# Patient Record
Sex: Female | Born: 1945 | Race: White | Hispanic: No | Marital: Married | Smoking: Former smoker
Health system: Southern US, Community
[De-identification: ages and names within clinical notes are randomized; demographics above are authoritative.]

## PROBLEM LIST (undated history)

## (undated) DIAGNOSIS — S22079A Unspecified fracture of T9-T10 vertebra, initial encounter for closed fracture: Secondary | ICD-10-CM

## (undated) DIAGNOSIS — I351 Nonrheumatic aortic (valve) insufficiency: Secondary | ICD-10-CM

## (undated) DIAGNOSIS — E785 Hyperlipidemia, unspecified: Secondary | ICD-10-CM

## (undated) DIAGNOSIS — E039 Hypothyroidism, unspecified: Secondary | ICD-10-CM

## (undated) DIAGNOSIS — M810 Age-related osteoporosis without current pathological fracture: Secondary | ICD-10-CM

## (undated) HISTORY — DX: Hyperlipidemia, unspecified: E78.5

## (undated) HISTORY — PX: TUBAL LIGATION: SHX77

## (undated) HISTORY — DX: Nonrheumatic aortic (valve) insufficiency: I35.1

## (undated) HISTORY — DX: Unspecified fracture of t9-t10 vertebra, initial encounter for closed fracture: S22.079A

## (undated) HISTORY — DX: Hypothyroidism, unspecified: E03.9

## (undated) HISTORY — DX: Age-related osteoporosis without current pathological fracture: M81.0

---

## 2018-10-01 ENCOUNTER — Other Ambulatory Visit: Payer: Self-pay

## 2018-10-01 DIAGNOSIS — Z20822 Contact with and (suspected) exposure to covid-19: Secondary | ICD-10-CM

## 2018-10-02 LAB — NOVEL CORONAVIRUS, NAA: SARS-CoV-2, NAA: NOT DETECTED

## 2018-12-31 ENCOUNTER — Other Ambulatory Visit: Payer: Self-pay

## 2018-12-31 ENCOUNTER — Emergency Department
Admission: EM | Admit: 2018-12-31 | Discharge: 2018-12-31 | Disposition: A | Discharge status: Home / Self Care | Payer: Medicare HMO | Attending: Emergency Medicine | Admitting: Emergency Medicine

## 2018-12-31 ENCOUNTER — Emergency Department: Payer: Medicare HMO

## 2018-12-31 DIAGNOSIS — M5136 Other intervertebral disc degeneration, lumbar region: Secondary | ICD-10-CM | POA: Diagnosis not present

## 2018-12-31 DIAGNOSIS — M545 Low back pain: Secondary | ICD-10-CM | POA: Diagnosis present

## 2018-12-31 DIAGNOSIS — Z87891 Personal history of nicotine dependence: Secondary | ICD-10-CM | POA: Diagnosis not present

## 2018-12-31 LAB — URINALYSIS, COMPLETE (UACMP) WITH MICROSCOPIC
Bilirubin Urine: NEGATIVE
Glucose, UA: NEGATIVE mg/dL
Hgb urine dipstick: NEGATIVE
Ketones, ur: NEGATIVE mg/dL
Nitrite: NEGATIVE
Protein, ur: NEGATIVE mg/dL
Specific Gravity, Urine: 1.019 (ref 1.005–1.030)
pH: 5 (ref 5.0–8.0)

## 2018-12-31 MED ORDER — TRAMADOL HCL 50 MG PO TABS
50.0000 mg | ORAL_TABLET | Freq: Once | ORAL | Status: AC
  Administered 2018-12-31: 50 mg via ORAL
  Filled 2018-12-31: qty 1

## 2018-12-31 MED ORDER — TRAMADOL HCL 50 MG PO TABS: 50.0000 mg | ORAL_TABLET | Freq: Four times a day (QID) | ORAL | 0 refills | Status: DC | PRN

## 2018-12-31 NOTE — ED Triage Notes (Signed)
Pt c/o lower back pain for the past 2 days, denies any injury.

## 2018-12-31 NOTE — ED Notes (Signed)

## 2018-12-31 NOTE — ED Provider Notes (Signed)
[facility] Emergency Department Provider Note  ____________________________________________   None    (approximate)  I have reviewed the triage vital signs and the nursing notes.   HISTORY  Chief Complaint Back Pain   HPI Sandra Riddle is a 73 y.o. female presents to the ED with complaint of low back pain that is gotten worse over the last 2 days.  Patient denies any fall.  Patient reports that she has been moving a lot of things in her house and has been picking up boxes.  She denies any incontinence of bowel or bladder or saddle anesthesias.  There is no radicular pain.  Patient continues to to ambulate without any assistance until this morning.  Range of motion increases her pain.  She rates her pain as a 10/10.       History reviewed. No pertinent past medical history.  There are no active problems to display for this patient.   Past Surgical History:  Procedure Laterality Date  . TUBAL LIGATION      Prior to Admission medications   Medication Sig Start Date End Date Taking? Authorizing Provider  traMADol (ULTRAM) 50 MG tablet Take 1 tablet (50 mg total) by mouth every 6 (six) hours as needed for moderate pain. 12/31/18   Tommi RumpsSummers, Hillary Struss L, PA-C    Allergies Patient has no known allergies.  No family history on file.  Social History Social History   Tobacco Use  . Smoking status: Former Games developermoker  . Smokeless tobacco: Never Used  Substance Use Topics  . Alcohol use: Not Currently  . Drug use: Not Currently    Review of Systems Constitutional: No fever/chills Eyes: No visual changes. Cardiovascular: Denies chest pain. Respiratory: Denies shortness of breath. Gastrointestinal: No abdominal pain.  No nausea, no vomiting. Genitourinary: Negative for dysuria. Musculoskeletal: Positive for low back pain. Skin: Negative for rash. Neurological: Negative for headaches, focal weakness or numbness. ___________________________________________    PHYSICAL EXAM:  VITAL SIGNS: ED Triage Vitals  Enc Vitals Group     BP 12/31/18 0729 (!) 181/62     Pulse Rate 12/31/18 0729 78     Resp 12/31/18 0729 18     Temp 12/31/18 0729 98.4 F (36.9 C)     Temp Source 12/31/18 0729 Oral     SpO2 12/31/18 0729 100 %     Weight 12/31/18 0719 160 lb (72.6 kg)     Height 12/31/18 0719 5\' 2"  (1.575 m)     Head Circumference --      Peak Flow --      Pain Score 12/31/18 0719 10     Pain Loc --      Pain Edu? --      Excl. in [place]? --     Constitutional: Alert and oriented. Well appearing and in no acute distress. Eyes: Conjunctivae are normal.  Head: Atraumatic. Neck: No stridor.   Cardiovascular: Normal rate, regular rhythm. Grossly normal heart sounds.  Good peripheral circulation. Respiratory: Normal respiratory effort.  No retractions. Lungs CTAB. Gastrointestinal: Soft and nontender. No distention. No CVA tenderness. Musculoskeletal: On exam of the lower back there is no gross deformity however there is moderate tenderness to the L5-S1 area and paravertebral muscles bilaterally.  Moderate soft tissue tenderness SI joint area bilaterally but more on the left.  Range of motion is restricted secondary to patient's pain.  No skin discoloration or abrasions are seen.  Good muscle strength bilaterally on the lower extremities.  No point tenderness  is noted of the thoracic spine. Neurologic:  Normal speech and language. No gross focal neurologic deficits are appreciated. No gait instability. Skin:  Skin is warm, dry and intact. No rash noted. Psychiatric: Mood and affect are normal. Speech and behavior are normal.  ____________________________________________   LABS (all labs ordered are listed, but only abnormal results are displayed)  Labs Reviewed  URINALYSIS, COMPLETE (UACMP) WITH MICROSCOPIC - Abnormal; Notable for the following components:      Result Value   Color, Urine YELLOW (*)    APPearance HAZY (*)    Leukocytes,Ua TRACE  (*)    Bacteria, UA RARE (*)    All other components within normal limits    RADIOLOGY  Official radiology report(s): Dg Lumbar Spine 2-3 Views  Result Date: 12/31/2018 CLINICAL DATA:  Low back pain EXAM: LUMBAR SPINE - 2-3 VIEW COMPARISON:  None. FINDINGS: Frontal, lateral, and spot lumbosacral lateral images were obtained. There are 5 non-rib-bearing lumbar type vertebral bodies. There is mild lumbar dextroscoliosis. There is mild anterior wedging at T12 and L1. No other evident fracture. There is 3 mm of anterolisthesis of L5 on S1. No other spondylolisthesis evident. No other spondylolisthesis. There is moderate disc space narrowing at T12-L1, L1-2, and L2-3. There is slight disc space narrowing at L5-S1. Other disc spaces appear unremarkable. No erosive change evident. There is aortic atherosclerosis. IMPRESSION: 1. Age uncertain anterior wedging at T12 and to a slightly lesser extent at L1. 2. 3 mm of anterolisthesis of L5 on S1, likely due to underlying spondylosis. No other spondylolisthesis evident. 3. Disc space narrowing at several levels as noted. No erosive change. 4.  Aortic Atherosclerosis (ICD10-I70.0). Electronically Signed   By: Lowella Grip III M.D.   On: 12/31/2018 09:02    ____________________________________________   PROCEDURES  Procedure(s) performed (including Critical Care):  Procedures ____________________________________________   INITIAL IMPRESSION / ASSESSMENT AND PLAN / ED COURSE  As part of my medical decision making, I reviewed the following data within the electronic MEDICAL RECORD NUMBER Notes from prior ED visits and  Controlled Substance Database  73 year old female presents to the ED with complaint of low back pain for the last 2 days.  Patient states that she has been moving things in her home and there was not a known injury.  Patient denies any fall.  She is continue to ambulate without any assistance is been taking Tylenol for the last 2 days.   Patient states this morning when she got up it was more difficult.  She denied any urinary or bladder incontinence.  There is been no prior back problems or injuries.  Physical exam is consistent with a muscle skeletal strain.  Urinalysis was essentially negative with rare bacteria.  Patient was given tramadol prior to x-ray and patient states that she is feeling much better and actually walked to the restroom.  A prescription for tramadol was sent to the pharmacy.  Patient is aware that this medication could cause drowsiness and increase her risk for injury.  She will also use heat or ice to her lower back as needed.  We also discussed finding a PCP with both she and the daughter.  ____________________________________________   FINAL CLINICAL IMPRESSION(S) / ED DIAGNOSES  Final diagnoses:  Degenerative disc disease, lumbar     ED Discharge Orders         Ordered    traMADol (ULTRAM) 50 MG tablet  Every 6 hours PRN     12/31/18 0934  Note:  This document was prepared using Dragon voice recognition software and may include unintentional dictation errors.    Tommi Rumps, PA-C 12/31/18 1233    Emily Filbert, MD 12/31/18 1248

## 2018-12-31 NOTE — Discharge Instructions (Addendum)
Call [facility] make an appointment to establish a primary care provider.  Also the tramadol that you were given while in the ED was sent to [facility].  This medication is every 6 hours if needed for pain.  Be aware that this medication could cause drowsiness and increase your risk for falling.  You may use ice or heat to your back as needed for discomfort.  No lifting, pushing or pulling at this time.  No reaching above shoulder level.  Return to the emergency department if any severe worsening of your symptoms.

## 2019-01-09 ENCOUNTER — Other Ambulatory Visit: Payer: Self-pay | Admitting: Internal Medicine

## 2019-01-09 DIAGNOSIS — M4856XA Collapsed vertebra, not elsewhere classified, lumbar region, initial encounter for fracture: Secondary | ICD-10-CM

## 2019-01-12 ENCOUNTER — Other Ambulatory Visit: Payer: Self-pay

## 2019-01-12 ENCOUNTER — Ambulatory Visit
Admission: RE | Admit: 2019-01-12 | Discharge: 2019-01-12 | Disposition: A | Discharge status: Home / Self Care | Payer: Medicare HMO | Source: Ambulatory Visit | Attending: Internal Medicine | Admitting: Internal Medicine

## 2019-01-12 DIAGNOSIS — M4856XA Collapsed vertebra, not elsewhere classified, lumbar region, initial encounter for fracture: Secondary | ICD-10-CM | POA: Diagnosis not present

## 2019-04-20 ENCOUNTER — Encounter (INDEPENDENT_AMBULATORY_CARE_PROVIDER_SITE_OTHER): Payer: Medicare HMO | Admitting: Ophthalmology

## 2019-04-22 ENCOUNTER — Encounter (INDEPENDENT_AMBULATORY_CARE_PROVIDER_SITE_OTHER): Payer: Medicare HMO | Admitting: Ophthalmology

## 2019-04-23 ENCOUNTER — Encounter (INDEPENDENT_AMBULATORY_CARE_PROVIDER_SITE_OTHER): Payer: Medicare HMO | Admitting: Ophthalmology

## 2019-04-23 ENCOUNTER — Other Ambulatory Visit: Payer: Self-pay

## 2019-04-23 DIAGNOSIS — H35373 Puckering of macula, bilateral: Secondary | ICD-10-CM

## 2019-04-23 DIAGNOSIS — H26493 Other secondary cataract, bilateral: Secondary | ICD-10-CM

## 2019-04-23 DIAGNOSIS — H43813 Vitreous degeneration, bilateral: Secondary | ICD-10-CM | POA: Diagnosis not present

## 2019-04-23 DIAGNOSIS — H59033 Cystoid macular edema following cataract surgery, bilateral: Secondary | ICD-10-CM | POA: Diagnosis not present

## 2019-06-04 ENCOUNTER — Encounter (INDEPENDENT_AMBULATORY_CARE_PROVIDER_SITE_OTHER): Payer: Medicare HMO | Admitting: Ophthalmology

## 2019-06-04 DIAGNOSIS — H59033 Cystoid macular edema following cataract surgery, bilateral: Secondary | ICD-10-CM | POA: Diagnosis not present

## 2019-06-04 DIAGNOSIS — H35373 Puckering of macula, bilateral: Secondary | ICD-10-CM

## 2019-06-04 DIAGNOSIS — H43813 Vitreous degeneration, bilateral: Secondary | ICD-10-CM

## 2019-07-16 ENCOUNTER — Encounter (INDEPENDENT_AMBULATORY_CARE_PROVIDER_SITE_OTHER): Payer: Medicare HMO | Admitting: Ophthalmology

## 2019-07-16 ENCOUNTER — Other Ambulatory Visit: Payer: Self-pay

## 2019-07-16 DIAGNOSIS — H35371 Puckering of macula, right eye: Secondary | ICD-10-CM

## 2019-07-16 DIAGNOSIS — H43813 Vitreous degeneration, bilateral: Secondary | ICD-10-CM | POA: Diagnosis not present

## 2019-07-16 DIAGNOSIS — H59033 Cystoid macular edema following cataract surgery, bilateral: Secondary | ICD-10-CM

## 2019-07-30 ENCOUNTER — Emergency Department
Admission: EM | Admit: 2019-07-30 | Discharge: 2019-07-30 | Disposition: A | Discharge status: Home / Self Care | Payer: Medicare HMO | Attending: Student in an Organized Health Care Education/Training Program | Admitting: Student in an Organized Health Care Education/Training Program

## 2019-07-30 ENCOUNTER — Other Ambulatory Visit: Payer: Self-pay

## 2019-07-30 ENCOUNTER — Emergency Department: Payer: Medicare HMO

## 2019-07-30 ENCOUNTER — Encounter: Payer: Self-pay | Admitting: Emergency Medicine

## 2019-07-30 DIAGNOSIS — S13100A Subluxation of unspecified cervical vertebrae, initial encounter: Secondary | ICD-10-CM | POA: Diagnosis not present

## 2019-07-30 DIAGNOSIS — R519 Headache, unspecified: Secondary | ICD-10-CM | POA: Insufficient documentation

## 2019-07-30 DIAGNOSIS — Y9389 Activity, other specified: Secondary | ICD-10-CM | POA: Diagnosis not present

## 2019-07-30 DIAGNOSIS — Z87891 Personal history of nicotine dependence: Secondary | ICD-10-CM | POA: Insufficient documentation

## 2019-07-30 DIAGNOSIS — Y92410 Unspecified street and highway as the place of occurrence of the external cause: Secondary | ICD-10-CM | POA: Insufficient documentation

## 2019-07-30 DIAGNOSIS — Y999 Unspecified external cause status: Secondary | ICD-10-CM | POA: Diagnosis not present

## 2019-07-30 DIAGNOSIS — S199XXA Unspecified injury of neck, initial encounter: Secondary | ICD-10-CM | POA: Diagnosis present

## 2019-07-30 MED ORDER — OXYCODONE HCL 5 MG PO TABS
5.0000 mg | ORAL_TABLET | Freq: Once | ORAL | Status: AC
  Administered 2019-07-30: 5 mg via ORAL
  Filled 2019-07-30: qty 1

## 2019-07-30 MED ORDER — DIAZEPAM 2 MG PO TABS: 2.0000 mg | ORAL_TABLET | Freq: Three times a day (TID) | ORAL | 0 refills | Status: DC | PRN
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
## 2019-07-30 NOTE — Discharge Instructions (Signed)
You have been prescribed a very low dose of Valium which is used as both a muscle relaxer and helps with anxiety.  If you take tramadol, make sure you space it out 4 hours before or after the muscle relaxer.  Follow-up with your primary care provider for symptoms that are not improving over the next week or so.  Use ice 20 minutes/h while awake on sore areas.  Return to the emergency department for symptoms of change or worsen if you are unable to schedule an appointment.

## 2019-08-27 ENCOUNTER — Encounter (INDEPENDENT_AMBULATORY_CARE_PROVIDER_SITE_OTHER): Payer: Medicare HMO | Admitting: Ophthalmology

## 2019-08-27 ENCOUNTER — Other Ambulatory Visit: Payer: Self-pay

## 2019-08-27 DIAGNOSIS — H35373 Puckering of macula, bilateral: Secondary | ICD-10-CM | POA: Diagnosis not present

## 2019-08-27 DIAGNOSIS — H59033 Cystoid macular edema following cataract surgery, bilateral: Secondary | ICD-10-CM

## 2019-08-27 DIAGNOSIS — H43813 Vitreous degeneration, bilateral: Secondary | ICD-10-CM | POA: Diagnosis not present

## 2019-10-21 ENCOUNTER — Encounter (INDEPENDENT_AMBULATORY_CARE_PROVIDER_SITE_OTHER): Payer: Medicare HMO | Admitting: Ophthalmology

## 2019-10-21 ENCOUNTER — Other Ambulatory Visit: Payer: Self-pay

## 2019-10-21 DIAGNOSIS — H35373 Puckering of macula, bilateral: Secondary | ICD-10-CM

## 2019-10-21 DIAGNOSIS — H59033 Cystoid macular edema following cataract surgery, bilateral: Secondary | ICD-10-CM

## 2019-10-21 DIAGNOSIS — H43813 Vitreous degeneration, bilateral: Secondary | ICD-10-CM

## 2019-10-22 ENCOUNTER — Encounter (INDEPENDENT_AMBULATORY_CARE_PROVIDER_SITE_OTHER): Payer: Medicare HMO | Admitting: Ophthalmology

## 2019-12-02 ENCOUNTER — Encounter (INDEPENDENT_AMBULATORY_CARE_PROVIDER_SITE_OTHER): Payer: Medicare HMO | Admitting: Ophthalmology

## 2019-12-02 ENCOUNTER — Other Ambulatory Visit: Payer: Self-pay

## 2019-12-02 DIAGNOSIS — H59033 Cystoid macular edema following cataract surgery, bilateral: Secondary | ICD-10-CM | POA: Diagnosis not present

## 2019-12-02 DIAGNOSIS — H35371 Puckering of macula, right eye: Secondary | ICD-10-CM

## 2020-01-27 ENCOUNTER — Other Ambulatory Visit: Payer: Self-pay

## 2020-01-27 ENCOUNTER — Encounter (INDEPENDENT_AMBULATORY_CARE_PROVIDER_SITE_OTHER): Payer: Medicare HMO | Admitting: Ophthalmology

## 2020-01-27 DIAGNOSIS — H59033 Cystoid macular edema following cataract surgery, bilateral: Secondary | ICD-10-CM

## 2020-01-27 DIAGNOSIS — H43813 Vitreous degeneration, bilateral: Secondary | ICD-10-CM

## 2020-01-27 DIAGNOSIS — H35373 Puckering of macula, bilateral: Secondary | ICD-10-CM

## 2020-03-28 ENCOUNTER — Encounter (INDEPENDENT_AMBULATORY_CARE_PROVIDER_SITE_OTHER): Payer: Medicare HMO | Admitting: Ophthalmology

## 2020-03-28 ENCOUNTER — Other Ambulatory Visit: Payer: Self-pay

## 2020-03-28 DIAGNOSIS — H35373 Puckering of macula, bilateral: Secondary | ICD-10-CM | POA: Diagnosis not present

## 2020-03-28 DIAGNOSIS — H59033 Cystoid macular edema following cataract surgery, bilateral: Secondary | ICD-10-CM | POA: Diagnosis not present

## 2020-03-28 DIAGNOSIS — H43813 Vitreous degeneration, bilateral: Secondary | ICD-10-CM

## 2020-06-24 ENCOUNTER — Other Ambulatory Visit: Payer: Self-pay

## 2020-06-24 ENCOUNTER — Encounter (INDEPENDENT_AMBULATORY_CARE_PROVIDER_SITE_OTHER): Payer: Medicare HMO | Admitting: Ophthalmology

## 2020-06-24 DIAGNOSIS — H35373 Puckering of macula, bilateral: Secondary | ICD-10-CM | POA: Diagnosis not present

## 2020-06-24 DIAGNOSIS — H43813 Vitreous degeneration, bilateral: Secondary | ICD-10-CM

## 2020-06-24 DIAGNOSIS — H59033 Cystoid macular edema following cataract surgery, bilateral: Secondary | ICD-10-CM

## 2020-10-24 ENCOUNTER — Encounter (INDEPENDENT_AMBULATORY_CARE_PROVIDER_SITE_OTHER): Payer: Medicare HMO | Admitting: Ophthalmology

## 2020-11-07 ENCOUNTER — Other Ambulatory Visit: Payer: Self-pay

## 2020-11-07 ENCOUNTER — Encounter (INDEPENDENT_AMBULATORY_CARE_PROVIDER_SITE_OTHER): Payer: Medicare HMO | Admitting: Ophthalmology

## 2020-11-07 DIAGNOSIS — H35372 Puckering of macula, left eye: Secondary | ICD-10-CM | POA: Diagnosis not present

## 2020-11-07 DIAGNOSIS — H59033 Cystoid macular edema following cataract surgery, bilateral: Secondary | ICD-10-CM

## 2020-11-07 DIAGNOSIS — H43813 Vitreous degeneration, bilateral: Secondary | ICD-10-CM | POA: Diagnosis not present
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
## 2021-03-09 ENCOUNTER — Other Ambulatory Visit: Payer: Self-pay

## 2021-03-09 ENCOUNTER — Encounter (INDEPENDENT_AMBULATORY_CARE_PROVIDER_SITE_OTHER): Payer: Medicare HMO | Admitting: Ophthalmology

## 2021-03-09 DIAGNOSIS — H59033 Cystoid macular edema following cataract surgery, bilateral: Secondary | ICD-10-CM

## 2021-03-09 DIAGNOSIS — H35373 Puckering of macula, bilateral: Secondary | ICD-10-CM

## 2021-03-09 DIAGNOSIS — H43813 Vitreous degeneration, bilateral: Secondary | ICD-10-CM | POA: Diagnosis not present

## 2021-07-07 ENCOUNTER — Encounter (INDEPENDENT_AMBULATORY_CARE_PROVIDER_SITE_OTHER): Payer: Medicare HMO | Admitting: Ophthalmology

## 2021-07-07 DIAGNOSIS — H43813 Vitreous degeneration, bilateral: Secondary | ICD-10-CM | POA: Diagnosis not present

## 2021-07-07 DIAGNOSIS — H59033 Cystoid macular edema following cataract surgery, bilateral: Secondary | ICD-10-CM

## 2021-07-07 DIAGNOSIS — H35373 Puckering of macula, bilateral: Secondary | ICD-10-CM

## 2021-11-10 ENCOUNTER — Encounter (INDEPENDENT_AMBULATORY_CARE_PROVIDER_SITE_OTHER): Payer: Medicare HMO | Admitting: Ophthalmology

## 2021-11-10 DIAGNOSIS — H35373 Puckering of macula, bilateral: Secondary | ICD-10-CM

## 2021-11-10 DIAGNOSIS — H59033 Cystoid macular edema following cataract surgery, bilateral: Secondary | ICD-10-CM | POA: Diagnosis not present

## 2021-11-10 DIAGNOSIS — H43813 Vitreous degeneration, bilateral: Secondary | ICD-10-CM

## 2022-03-08 ENCOUNTER — Other Ambulatory Visit: Payer: Self-pay | Admitting: Internal Medicine

## 2022-03-08 DIAGNOSIS — E785 Hyperlipidemia, unspecified: Secondary | ICD-10-CM

## 2022-03-08 DIAGNOSIS — R413 Other amnesia: Secondary | ICD-10-CM

## 2022-03-12 ENCOUNTER — Other Ambulatory Visit: Payer: Self-pay | Admitting: Internal Medicine

## 2022-03-12 DIAGNOSIS — Z8781 Personal history of (healed) traumatic fracture: Secondary | ICD-10-CM

## 2022-03-22 ENCOUNTER — Ambulatory Visit
Admission: RE | Admit: 2022-03-22 | Discharge: 2022-03-22 | Disposition: A | Discharge status: Home / Self Care | Payer: Medicare HMO | Source: Ambulatory Visit | Attending: Internal Medicine | Admitting: Internal Medicine

## 2022-03-22 DIAGNOSIS — Z8781 Personal history of (healed) traumatic fracture: Secondary | ICD-10-CM | POA: Diagnosis not present

## 2022-03-23 ENCOUNTER — Encounter (INDEPENDENT_AMBULATORY_CARE_PROVIDER_SITE_OTHER): Payer: Medicare HMO | Admitting: Ophthalmology

## 2022-03-23 ENCOUNTER — Other Ambulatory Visit: Payer: Self-pay

## 2022-03-23 ENCOUNTER — Telehealth: Payer: Self-pay | Admitting: Orthopedic Surgery

## 2022-03-23 ENCOUNTER — Ambulatory Visit
Admission: RE | Admit: 2022-03-23 | Discharge: 2022-03-23 | Disposition: A | Discharge status: Home / Self Care | Payer: Self-pay | Source: Ambulatory Visit | Attending: Orthopedic Surgery | Admitting: Orthopedic Surgery

## 2022-03-23 DIAGNOSIS — Z049 Encounter for examination and observation for unspecified reason: Secondary | ICD-10-CM
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
## 2022-03-23 NOTE — Progress Notes (Signed)
Referring Physician:  Donnamarie Rossetti, PA-C [place],  [place] [address]  Primary Physician:  Adin Hector, MD  History of Present Illness: 03/26/2022 [name] has a history of osteoporosis, hyperlipidemia, and hypothyroidism.   Saw her PCP on 03/05/22 with a few months of increased mid back pain. She cares for her husband at home. Xrays from that day showed T10 compression fracture. She had thoracic MRI on 03/22/22 and is here for evaluation.  She has constant mid to lower back pain that is now severe. Has been getting worse since her visit on 03/05/22. She notes radiation of pain into left side of her ribs. No arm or leg pain. No numbness, tingling, or weakness. Pain is worse with getting up, turning, and moving.   Recently given zanaflex by PCP along with norco 5- minimal relief with this.  Past Surgery: no lumbar surgery  Sandra Riddle has no symptoms of cervical myelopathy.  The symptoms are causing a significant impact on the patient's life.   Review of Systems:  A 10 point review of systems is negative, except for the pertinent positives and negatives detailed in the HPI.  Past Medical History: No past medical history on file.  Past Surgical History: Past Surgical History:  Procedure Laterality Date   TUBAL LIGATION      Allergies: Allergies as of 03/26/2022   (No Known Allergies)    Medications: Outpatient Encounter Medications as of 03/26/2022  Medication Sig   [DISCONTINUED] diazepam (VALIUM) 2 MG tablet Take 1 tablet (2 mg total) by mouth every 8 (eight) hours as needed for muscle spasms.   [DISCONTINUED] traMADol (ULTRAM) 50 MG tablet Take 1 tablet (50 mg total) by mouth every 6 (six) hours as needed for moderate pain.   No facility-administered encounter medications on file as of 03/26/2022.    Social History: Social History   Tobacco Use   Smoking status: Former   Smokeless tobacco: Never  Substance Use Topics   Alcohol  use: Not Currently   Drug use: Not Currently    Family Medical History: No family history on file.  Physical Examination: There were no vitals filed for this visit.  General: Patient is well developed, well nourished, calm, collected, and in no apparent distress. Attention to examination is appropriate.  Respiratory: Patient is breathing without any difficulty.   NEUROLOGICAL:     Awake, alert, oriented to person, place, and time.  Speech is clear and fluent. Fund of knowledge is appropriate.   Cranial Nerves: Pupils equal round and reactive to light.  Facial tone is symmetric.    ROM of TL spine not tested.   She has point tenderness at TL junction.   No abnormal lesions on exposed skin.   Strength: Side Biceps Triceps Deltoid Interossei Grip Wrist Ext. Wrist Flex.  R 5 5 5 5 5 5 5   L 5 5 5 5 5 5 5    Side Iliopsoas Quads Hamstring PF DF EHL  R 5 5 5 5 5 5   L 5 5 5 5 5 5    Reflexes are 2+ and symmetric at the biceps, triceps, brachioradialis, patella and achilles.   Hoffman's is absent.  Clonus is not present.   Bilateral upper and lower extremity sensation is intact to light touch.     Gait is slow.   She is in obvious pain and changes position frequently.   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   Thoracic spine segmentation: Appears to be normal, and concordant with that used on the 2020 lumbar MRI.   Alignment: Mild straightening of thoracic kyphosis. No significant spondylolisthesis.   Vertebrae: Chronic L1 compression fracture, vertebral plana now and mildly progressed compared to the loss of height in 2020.   Moderate to severe T10 compression fracture with up to 65% central loss of vertebral body height, confluent marrow edema throughout the compressed body, and edema tracking into the bilateral T10  pedicles. Probable associated nondisplaced right pedicle fracture on series 19, image 6. There is central retropulsion of bone at that level contributing to spinal stenosis. See additional details below.   Faint marrow edema at the T9 anterior inferior endplate is probably reactive. Otherwise T9 and T11 appear intact. Mild chronic T12 superior endplate compression is unchanged since 2020.   Background thoracic bone marrow signal is within normal limits, with intermittent benign thoracic vertebral hemangioma demonstrating intrinsic T1 hyperintensity. No other marrow edema or evidence of acute osseous abnormality.   Cord: The thoracic spinal cord is normal above T10. Mild cord mass effect at the T10 level, see details below. No convincing cord signal abnormality there.   Cord signal and morphology is normal at both T11 and T12. But chronic retropulsion of L1 results in mild mass effect just above the conus medullaris (series 17, image 8). No conus signal abnormality.   Paraspinal and other soft tissues: Negative visible thoracic and abdominal viscera except for trace layering pleural fluid bilaterally.   There is mild T10 level paraspinal edema or hematoma.   Disc levels:   Negative for age from the cervicothoracic junction through T8-T9.   T9-T10 and T10-T11: Relatively mild disc bulging. Retropulsion of the T10 vertebral body superimposed on mild facet and ligament flavum hypertrophy results in mild spinal stenosis and cord mass effect (series 17, image 10). And also contributes to mild bilateral T9 and T10 neural foraminal stenosis.   T11-T12: Negative for age   T12-L1: Circumferential disc bulging superimposed on chronic L1 retropulsion resulting in mild spinal stenosis here at the level of the conus medullaris. No significant foraminal stenosis.   IMPRESSION: 1. Acute or subacute T10 compression fracture is moderate to severe, with 65% central loss of vertebral body  height, probable associated nondisplaced right pedicle fracture, and retropulsion contributing to mild spinal stenosis at that level WITH spinal cord mass effect. No associated cord signal abnormality. Mild bilateral T9 and T10 foraminal stenosis.   2. 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 Chronic retropulsion of the latter resulting in mild spinal stenosis and mild mass effect at the conus medullaris, but no conus signal abnormality.   Electronically Signed: By: Genevie Saanya M.D. On: 03/23/2022 11:44  I have personally reviewed the images and agree with the above interpretation.  Xrays of thoracic spine dated 03/05/22:  Compression fracture at T10 and L1.   Radiology report not available for above xrays.   Above xrays and MRI reviewed with Dr. Izora Ribas prior to her visit.   Assessment and Plan: Ms. Tomczak is a pleasant 77 y.o. female has constant mid to lower back pain that is now severe. Has been getting worse since her visit on 03/05/22. She notes radiation of pain into left side of her ribs. No arm or leg pain. No numbness, tingling, or weakness.   Xrays from 03/05/22 showed  T10 compression fracture. MRI of thoracic spine shows acute/subacute compression fracture at T10 with probable right pedicle fracture. Also with chronic fractures at T12 and L1.   Imaging reviewed with Dr. Myer Haff prior to her visit. No surgery recommended. Can treat conservatively with TLSO brace and medications.   Treatment options discussed with patient and following plan made:   - She will stop the norco 5 and zanaflex.  - New prescription for norco 7.5 to take every 4 hours prn. Reviewed dosing and side effects.  - PMP reviewed- she was given 5 day supply of norco 5 on 03/23/22. This is not controlling her pain.  - Prescription for robaxin to take prn muscle spasms. Reviewed dosing and side effects. Discussed this can  cause drowsiness.  - Script for TLSO brace sent to [facility] in [place]. She was given their contact information.  - She should wear brace when up and walking. No bending, twisting, or lifting. Do not wear brace to sleep. Can remove brace when she sits and watches TV.  - Follow up with me in 4 weeks with repeat xrays.  - Will call her on Wednesday to check on her and make sure pain is more controlled.  - She will f/u with PCP regarding treatment of osteoporosis to see if anything needs changed.   I spent a total of 50 minutes in face-to-face and non-face-to-face activities related to this patient's care today including review of outside records, review of imaging, review of symptoms, physical exam, discussion of differential diagnosis, discussion of treatment options, and documentation.   Thank you for involving me in the care of this patient.   Drake Leach PA-C Dept. of Neurosurgery

## 2022-03-26 ENCOUNTER — Ambulatory Visit: Payer: Medicare HMO

## 2022-03-26 ENCOUNTER — Encounter: Payer: Self-pay | Admitting: Orthopedic Surgery

## 2022-03-26 ENCOUNTER — Ambulatory Visit (INDEPENDENT_AMBULATORY_CARE_PROVIDER_SITE_OTHER): Payer: Medicare HMO | Admitting: Orthopedic Surgery

## 2022-03-26 VITALS — BP 163/73 | HR 92 | Ht 59.0 in | Wt 142.8 lb

## 2022-03-26 DIAGNOSIS — S22079A Unspecified fracture of T9-T10 vertebra, initial encounter for closed fracture: Secondary | ICD-10-CM | POA: Diagnosis not present

## 2022-03-26 MED ORDER — METHOCARBAMOL 500 MG PO TABS: 500.0000 mg | ORAL_TABLET | Freq: Three times a day (TID) | ORAL | 0 refills | Status: DC | PRN

## 2022-03-26 MED ORDER — HYDROCODONE-ACETAMINOPHEN 7.5-325 MG PO TABS: 1.0000 | ORAL_TABLET | ORAL | 0 refills | Status: DC | PRN

## 2022-03-26 NOTE — Patient Instructions (Signed)
It was so nice to see you today. Thank you so much for coming in.    You have a compression fracture (broken bone) at T10 and I think this is what is causing your pain.   I sent a script for a brace to the [facility]. They should call you or you can call them at [phone]. They are at [employer] in [place].   You do no need to wear brace in bed. You can take off if you are sitting and watching TV. Wear the brace when you are up and about. No bending, twisting, or lifting.   I sent a prescription for norco 7.5 to help with severe pain. Take as directed with food and take only as needed. Stop the norco 5 for now. Remember this can make you sleepy and/or constipated.   I also sent a prescription for methocarbamol to help with muscle spasms. Use only as needed and be careful, this can make you sleepy.   I want to see you back in 4 weeks. I ordered xrays of your mid back to get before the appointment. Show up 45-60 minutes before your appointment with me. You can go across the street to get these at [place] (building with the [place] [address]). You do not need any appointment.   We will call you on Wednesday to check on you.   Please do not hesitate to call if you have any questions or concerns. You can also message me in [place].   Geronimo Boot PA-C [phone]

## 2022-03-26 NOTE — Progress Notes (Signed)
Order faxed to [facility] for TLSO brace.

## 2022-03-28 ENCOUNTER — Other Ambulatory Visit: Payer: Self-pay | Admitting: Internal Medicine

## 2022-03-28 DIAGNOSIS — S22070S Wedge compression fracture of T9-T10 vertebra, sequela: Secondary | ICD-10-CM

## 2022-04-05 ENCOUNTER — Telehealth: Payer: Self-pay | Admitting: Orthopedic Surgery

## 2022-04-05 DIAGNOSIS — S22079A Unspecified fracture of T9-T10 vertebra, initial encounter for closed fracture: Secondary | ICD-10-CM

## 2022-04-05 MED ORDER — HYDROCODONE-ACETAMINOPHEN 7.5-325 MG PO TABS: 1.0000 | ORAL_TABLET | Freq: Four times a day (QID) | ORAL | 0 refills | Status: DC | PRN

## 2022-04-05 NOTE — Telephone Encounter (Signed)
PMP reviewed and is appropriate.   Will change to q 6 hours since she is doing better.   Please let her know prescription was sent to pharmacy.

## 2022-04-05 NOTE — Telephone Encounter (Signed)
Patient aware of medication fill and change.

## 2022-04-05 NOTE — Telephone Encounter (Signed)
-----   Message from Peggyann Shoals sent at 04/05/2022  9:20 AM EST ----- Regarding: med refill Contact: [phone] Hydrocone 5mg  every 4 hours [place] She is better. Fu 04/23/2022

## 2022-04-09 ENCOUNTER — Encounter (INDEPENDENT_AMBULATORY_CARE_PROVIDER_SITE_OTHER): Payer: Medicare HMO | Admitting: Ophthalmology

## 2022-04-16 ENCOUNTER — Ambulatory Visit
Admission: RE | Admit: 2022-04-16 | Discharge: 2022-04-16 | Disposition: A | Discharge status: Home / Self Care | Payer: Medicare HMO | Source: Ambulatory Visit | Attending: Internal Medicine | Admitting: Internal Medicine

## 2022-04-16 DIAGNOSIS — E785 Hyperlipidemia, unspecified: Secondary | ICD-10-CM | POA: Diagnosis present

## 2022-04-16 DIAGNOSIS — R413 Other amnesia: Secondary | ICD-10-CM | POA: Insufficient documentation

## 2022-04-20 NOTE — Progress Notes (Unsigned)
Referring Physician:  Adin Hector, MD [place] [facility] [place],  [place] [address]  Primary Physician:  Adin Hector, MD  History of Present Illness: 04/23/2022 [name] has a history of osteoporosis, hyperlipidemia, and hypothyroidism.   Last seen by me on 03/26/22 for T10 compression fracture, date unknown, but seen on xrays on 03/05/22.   She was given TLSO brace at her last visit along with robaxin and norco 7.5 to take or severe pain.   She is here for follow up and repeat xrays.   She is feeling much better. She has intermittent mid back pain that is tolerable. No radiation of pain to ribs. No arm or leg pain. No numbness, tingling, or weakness.   She is wearing TLSO brace at home. Is not wearing it today. She is taken prn tylenol. Not taking any hydrocodone.   Dr. Caryl Comes has done referral to IR for possible kyphoplasty. She declined.   Past Surgery: no lumbar surgery   Review of Systems:  A 10 point review of systems is negative, except for the pertinent positives and negatives detailed in the HPI.  Past Medical History: Past Medical History:  Diagnosis Date   Closed T10 fracture ([place])    Hyperlipidemia    Hypothyroidism    Moderate aortic valve insufficiency    Osteoporosis     Past Surgical History: Past Surgical History:  Procedure Laterality Date   TUBAL LIGATION      Allergies: Allergies as of 04/23/2022   (No Known Allergies)    Medications: Outpatient Encounter Medications as of 04/23/2022  Medication Sig   acetaminophen (TYLENOL) 650 MG CR tablet Take 650 mg by mouth every 8 (eight) hours as needed for pain.   alendronate (FOSAMAX) 70 MG tablet Take 70 mg by mouth once a week.   CALCIUM-VITAMIN D PO Take by mouth daily.   diphenhydrAMINE (BENADRYL ALLERGY) 25 MG tablet Take 25 mg by mouth in the morning.   levothyroxine (SYNTHROID) 75 MCG tablet Take 75 mcg by mouth daily before breakfast.   prednisoLONE  acetate (PRED FORTE) 1 % ophthalmic suspension Place 1 drop into both eyes 4 (four) times daily.   PROLENSA 0.07 % SOLN Apply 1 drop to eye at bedtime.   rosuvastatin (CRESTOR) 5 MG tablet Take 5 mg by mouth daily.   [DISCONTINUED] HYDROcodone-acetaminophen (NORCO) 7.5-325 MG tablet Take 1 tablet by mouth every 6 (six) hours as needed for severe pain.   [DISCONTINUED] methocarbamol (ROBAXIN) 500 MG tablet Take 1 tablet (500 mg total) by mouth every 8 (eight) hours as needed for muscle spasms. Be careful, this can make you sleepy.   No facility-administered encounter medications on file as of 04/23/2022.    Social History: Social History   Tobacco Use   Smoking status: Former   Smokeless tobacco: Never  Substance Use Topics   Alcohol use: Not Currently   Drug use: Not Currently    Family Medical History: History reviewed. No pertinent family history.  Physical Examination: Vitals:   04/23/22 1331  BP: 126/76      Awake, alert, oriented to person, place, and time.  Speech is clear and fluent. Fund of knowledge is appropriate.   Cranial Nerves: Pupils equal round and reactive to light.  Facial tone is symmetric.    ROM of TL spine not tested.   She has no point tenderness at TL junction.   No abnormal lesions on exposed skin.   Strength: Side  Biceps Triceps Deltoid Interossei Grip Wrist Ext. Wrist Flex.  R '5 5 5 5 5 5 5  '$ L '5 5 5 5 5 5 5   '$ Side Iliopsoas Quads Hamstring PF DF EHL  R '5 5 5 5 5 5  '$ L '5 5 5 5 5 5   '$ Reflexes are 2+ and symmetric at the biceps, triceps, brachioradialis, patella and achilles.   Hoffman's is absent.  Clonus is not present.   Bilateral upper and lower extremity sensation is intact to light touch.     Gait is normal.   Medical Decision Making  Imaging: Thoracic xrays dated 04/23/22:  Slight progression of T10 compression fracture in comparison to previous thoracic MRI. Also with chronic fractures at T12 and L1.   Radiology report not  available yet for above xrays.    Assessment and Plan: [name] is a pleasant 77 y.o. female has known T10 fracture, date unknown, but seen on xrays on 03/05/22.   She is feeling much better. She has intermittent mid back pain that is tolerable. No radiation of pain to ribs. No arm or leg pain.   Xrays show slight progression of T10 fracture from thoracic MRI. Also with chronic fractures at T12 and L1.   Treatment options discussed with patient and following plan made:   - She is doing well and improving. Discussed fracture has progressed, but since she is doing so well clinically I would hold on further treatment. She agrees.  - Discussed kyphoplasty (PCP referred her). I would hold off as she is doing well. She agrees.  - She should continue with TLSO brace (not wearing today). No bending, twisting, or lifting.  - Continue on prn OTC tylenol as directed on bottle.  - Follow up in 6 weeks for repeat xrays. If doing well, can release to f/u prn.   I spent a total of 20 minutes in face-to-face and non-face-to-face activities related to this patient's care today including review of outside records, review of imaging, review of symptoms, physical exam, discussion of differential diagnosis, discussion of treatment options, and documentation.   Thank you for involving me in the care of this patient.   Geronimo Boot PA-C Dept. of Neurosurgery

## 2022-04-23 ENCOUNTER — Ambulatory Visit
Admission: RE | Admit: 2022-04-23 | Discharge: 2022-04-23 | Disposition: A | Discharge status: Home / Self Care | Payer: Medicare HMO | Attending: Orthopedic Surgery | Admitting: Orthopedic Surgery

## 2022-04-23 ENCOUNTER — Encounter: Payer: Self-pay | Admitting: Orthopedic Surgery

## 2022-04-23 ENCOUNTER — Ambulatory Visit
Admission: RE | Admit: 2022-04-23 | Discharge: 2022-04-23 | Disposition: A | Discharge status: Home / Self Care | Payer: Medicare HMO | Source: Ambulatory Visit | Attending: Orthopedic Surgery | Admitting: Orthopedic Surgery

## 2022-04-23 ENCOUNTER — Ambulatory Visit: Payer: Medicare HMO | Admitting: Orthopedic Surgery

## 2022-04-23 VITALS — BP 126/76 | Ht 60.0 in | Wt 136.6 lb

## 2022-04-23 DIAGNOSIS — S22079D Unspecified fracture of T9-T10 vertebra, subsequent encounter for fracture with routine healing: Secondary | ICD-10-CM

## 2022-04-23 DIAGNOSIS — S22079A Unspecified fracture of T9-T10 vertebra, initial encounter for closed fracture: Secondary | ICD-10-CM | POA: Diagnosis present

## 2022-04-23 NOTE — Patient Instructions (Signed)
It was so nice to see you today. Thank you so much for coming in.   Your xrays showed some progression of your fracture at T10. Since you are doing so well, I would not worry about this.   Continue to wear your TLSO brace. No bending, twisting, or lifting.   Get repeat xrays prior to your follow up with me. You can get these at [facility] (building with the [place]). You do not need any appointment.  I will see you back in 6 weeks. Please do not hesitate to call if you have any questions or concerns. You can also message me in [place].   Geronimo Boot PA-C [phone]

## 2022-05-14 ENCOUNTER — Encounter (INDEPENDENT_AMBULATORY_CARE_PROVIDER_SITE_OTHER): Payer: Medicare HMO | Admitting: Ophthalmology

## 2022-05-14 DIAGNOSIS — H35373 Puckering of macula, bilateral: Secondary | ICD-10-CM

## 2022-05-14 DIAGNOSIS — H59033 Cystoid macular edema following cataract surgery, bilateral: Secondary | ICD-10-CM

## 2022-05-14 DIAGNOSIS — H43813 Vitreous degeneration, bilateral: Secondary | ICD-10-CM | POA: Diagnosis not present

## 2022-06-07 ENCOUNTER — Other Ambulatory Visit: Payer: Self-pay | Admitting: Neurology

## 2022-06-07 DIAGNOSIS — R4189 Other symptoms and signs involving cognitive functions and awareness: Secondary | ICD-10-CM

## 2022-06-08 NOTE — Addendum Note (Signed)
Addended byDrake Leach on: 06/08/2022 02:00 PM   Modules accepted: Orders

## 2022-06-08 NOTE — Progress Notes (Signed)
Referring Physician:  Lynnea Ferrier, MD [address] [facility] [place],  [place] [address]  Primary Physician:  Lynnea Ferrier, MD  History of Present Illness: 06/11/2022 Sandra Riddle has a history of osteoporosis, hyperlipidemia, and hypothyroidism.   Last seen by me on 04/23/22 for T10 compression fracture, date unknown, but seen on xrays on 03/05/22. Also with chronic fractures at T12 and L1.   She was to continue with TLSO brace at her last visit. She is here for follow up.   She has no back pain today. She is feeling great! No leg pain. No numbness, tingling, or weakness.   Past Surgery: no lumbar surgery   Review of Systems:  A 10 point review of systems is negative, except for the pertinent positives and negatives detailed in the HPI.  Past Medical History: Past Medical History:  Diagnosis Date   Closed T10 fracture    Hyperlipidemia    Hypothyroidism    Moderate aortic valve insufficiency    Osteoporosis     Past Surgical History: Past Surgical History:  Procedure Laterality Date   TUBAL LIGATION      Allergies: Allergies as of 06/11/2022   (No Known Allergies)    Medications: Outpatient Encounter Medications as of 06/11/2022  Medication Sig   acetaminophen (TYLENOL) 650 MG CR tablet Take 650 mg by mouth every 8 (eight) hours as needed for pain.   alendronate (FOSAMAX) 70 MG tablet Take 70 mg by mouth once a week.   CALCIUM-VITAMIN D PO Take by mouth daily.   diphenhydrAMINE (BENADRYL ALLERGY) 25 MG tablet Take 25 mg by mouth in the morning.   dorzolamide-timolol (COSOPT) 2-0.5 % ophthalmic solution Apply 1 drop to eye 2 (two) times daily.   levothyroxine (SYNTHROID) 75 MCG tablet Take 75 mcg by mouth daily before breakfast.   Loteprednol Etabonate 0.5 % GEL Apply to eye.   prednisoLONE acetate (PRED FORTE) 1 % ophthalmic suspension Place 1 drop into both eyes 4 (four) times daily.   PROLENSA 0.07 % SOLN Apply 1 drop to eye at  bedtime.   rosuvastatin (CRESTOR) 5 MG tablet Take 5 mg by mouth daily.   No facility-administered encounter medications on file as of 06/11/2022.    Social History: Social History   Tobacco Use   Smoking status: Former   Smokeless tobacco: Never  Substance Use Topics   Alcohol use: Not Currently   Drug use: Not Currently    Family Medical History: History reviewed. No pertinent family history.  Physical Examination: Vitals:   06/11/22 1118  BP: 135/76  Pulse: 96  SpO2: 97%       Awake, alert, oriented to person, place, and time.  Speech is clear and fluent. Fund of knowledge is appropriate.   Cranial Nerves: Pupils equal round and reactive to light.  Facial tone is symmetric.    She has no point tenderness at TL junction.   No abnormal lesions on exposed skin.   Strength: Side Biceps Triceps Deltoid Interossei Grip Wrist Ext. Wrist Flex.  R 5 5 5 5 5 5 5   L 5 5 5 5 5 5 5    Side Iliopsoas Quads Hamstring PF DF EHL  R 5 5 5 5 5 5   L 5 5 5 5 5 5    Reflexes are 2+ and symmetric at the biceps, triceps, brachioradialis, patella and achilles.   Hoffman's is absent.  Clonus is not present.   Bilateral upper and lower extremity  sensation is intact to light touch.     Gait is normal.   Medical Decision Making  Imaging: Lumbar xrays dated 06/11/22:  Stable T10 compression fracture in comparison to previous thoracic xrays. Also with chronic fractures at T12 and L1.   Radiology report not available yet for above xrays.    Assessment and Plan: Sandra Riddle is a pleasant 77 y.o. female has known T10 fracture, date unknown, but seen on xrays on 03/05/22.   She is feeling much better with no current back pain. No leg pain. No numbness, tingling, or weakness.    Xrays show stable T10 fracture along with chronic fractures at T12 and L1.   Treatment options discussed with patient and following plan made:   - She can wean out of her TLSO brace. Still care with bending and  twisting. Would avoid lifting if possible.  - Follow up prn.   I spent a total of 15 minutes in face-to-face and non-face-to-face activities related to this patient's care today including review of outside records, review of imaging, review of symptoms, physical exam, discussion of differential diagnosis, discussion of treatment options, and documentation.   Drake Leach PA-C Dept. of Neurosurgery

## 2022-06-10 ENCOUNTER — Ambulatory Visit
Admission: RE | Admit: 2022-06-10 | Discharge: 2022-06-10 | Disposition: A | Discharge status: Home / Self Care | Payer: Medicare HMO | Source: Ambulatory Visit | Attending: Neurology | Admitting: Neurology

## 2022-06-10 DIAGNOSIS — R4189 Other symptoms and signs involving cognitive functions and awareness: Secondary | ICD-10-CM | POA: Insufficient documentation

## 2022-06-11 ENCOUNTER — Encounter: Payer: Self-pay | Admitting: Orthopedic Surgery

## 2022-06-11 ENCOUNTER — Ambulatory Visit
Admission: RE | Admit: 2022-06-11 | Discharge: 2022-06-11 | Disposition: A | Discharge status: Home / Self Care | Payer: Medicare HMO | Attending: Orthopedic Surgery | Admitting: Orthopedic Surgery

## 2022-06-11 ENCOUNTER — Ambulatory Visit: Payer: Medicare HMO | Admitting: Orthopedic Surgery

## 2022-06-11 ENCOUNTER — Ambulatory Visit
Admission: RE | Admit: 2022-06-11 | Discharge: 2022-06-11 | Disposition: A | Discharge status: Home / Self Care | Payer: Medicare HMO | Source: Ambulatory Visit | Attending: Orthopedic Surgery | Admitting: Orthopedic Surgery

## 2022-06-11 VITALS — BP 135/76 | HR 96 | Ht 60.0 in | Wt 137.8 lb

## 2022-06-11 DIAGNOSIS — S22079D Unspecified fracture of T9-T10 vertebra, subsequent encounter for fracture with routine healing: Secondary | ICD-10-CM

## 2022-06-11 DIAGNOSIS — S22070D Wedge compression fracture of T9-T10 vertebra, subsequent encounter for fracture with routine healing: Secondary | ICD-10-CM | POA: Diagnosis not present

## 2022-09-13 ENCOUNTER — Encounter (INDEPENDENT_AMBULATORY_CARE_PROVIDER_SITE_OTHER): Payer: Medicare HMO | Admitting: Ophthalmology

## 2022-09-13 DIAGNOSIS — H59033 Cystoid macular edema following cataract surgery, bilateral: Secondary | ICD-10-CM

## 2022-09-13 DIAGNOSIS — H35371 Puckering of macula, right eye: Secondary | ICD-10-CM | POA: Diagnosis not present

## 2022-09-13 DIAGNOSIS — H43813 Vitreous degeneration, bilateral: Secondary | ICD-10-CM

## 2023-01-18 ENCOUNTER — Encounter (INDEPENDENT_AMBULATORY_CARE_PROVIDER_SITE_OTHER): Payer: Medicare HMO | Admitting: Ophthalmology

## 2023-01-18 DIAGNOSIS — H43813 Vitreous degeneration, bilateral: Secondary | ICD-10-CM

## 2023-01-18 DIAGNOSIS — H59033 Cystoid macular edema following cataract surgery, bilateral: Secondary | ICD-10-CM

## 2023-03-01 NOTE — Progress Notes (Deleted)
 Referring Physician:  Fernande Ophelia JINNY DOUGLAS, MD [address] [facility] [place],  [place] [address]  Primary Physician:  Fernande Ophelia JINNY DOUGLAS, MD  History of Present Illness: 03/01/2023 [name] has a history of osteoporosis, hyperlipidemia, and hypothyroidism.   Last seen by me on 06/11/22 for T10 compression fracture, date unknown, but seen on xrays on 03/05/22. Also with chronic fractures at T12 and L1.   Her last xrays showed stable T10 fracture along with chronic fractures at T12 and L1. She had no low back pain and was to follow up prn.         She was to continue with TLSO brace at her last visit. She is here for follow up.   She has no back pain today. She is feeling great! No leg pain. No numbness, tingling, or weakness.   Past Surgery: no lumbar surgery   Review of Systems:  A 10 point review of systems is negative, except for the pertinent positives and negatives detailed in the HPI.  Past Medical History: Past Medical History:  Diagnosis Date   Closed T10 fracture (HCC)    Hyperlipidemia    Hypothyroidism    Moderate aortic valve insufficiency    Osteoporosis     Past Surgical History: Past Surgical History:  Procedure Laterality Date   TUBAL LIGATION      Allergies: Allergies as of 03/04/2023   (No Known Allergies)    Medications: Outpatient Encounter Medications as of 03/04/2023  Medication Sig   acetaminophen  (TYLENOL ) 650 MG CR tablet Take 650 mg by mouth every 8 (eight) hours as needed for pain.   alendronate (FOSAMAX) 70 MG tablet Take 70 mg by mouth once a week.   CALCIUM-VITAMIN D PO Take by mouth daily.   diphenhydrAMINE (BENADRYL ALLERGY) 25 MG tablet Take 25 mg by mouth in the morning.   dorzolamide-timolol (COSOPT) 2-0.5 % ophthalmic solution Apply 1 drop to eye 2 (two) times daily.   levothyroxine (SYNTHROID) 75 MCG tablet Take 75 mcg by mouth daily before breakfast.   Loteprednol Etabonate 0.5 % GEL Apply to eye.    prednisoLONE acetate (PRED FORTE) 1 % ophthalmic suspension Place 1 drop into both eyes 4 (four) times daily.   PROLENSA 0.07 % SOLN Apply 1 drop to eye at bedtime.   rosuvastatin (CRESTOR) 5 MG tablet Take 5 mg by mouth daily.   No facility-administered encounter medications on file as of 03/04/2023.    Social History: Social History   Tobacco Use   Smoking status: Former   Smokeless tobacco: Never  Substance Use Topics   Alcohol use: Not Currently   Drug use: Not Currently    Family Medical History: No family history on file.  Physical Examination: There were no vitals filed for this visit.  Awake, alert, oriented to person, place, and time.  Speech is clear and fluent. Fund of knowledge is appropriate.   Cranial Nerves: Pupils equal round and reactive to light.  Facial tone is symmetric.    She has no point tenderness at TL junction.   No abnormal lesions on exposed skin.   Strength: Side Biceps Triceps Deltoid Interossei Grip Wrist Ext. Wrist Flex.  R 5 5 5 5 5 5 5   L 5 5 5 5 5 5 5    Side Iliopsoas Quads Hamstring PF DF EHL  R 5 5 5 5 5 5   L 5 5 5 5 5 5    Reflexes are 2+ and symmetric at  the biceps, triceps, brachioradialis, patella and achilles.   Hoffman's is absent.  Clonus is not present.   Bilateral upper and lower extremity sensation is intact to light touch.     Gait is normal.   Medical Decision Making  Imaging: none   Assessment and Plan: [name] is a pleasant 78 y.o. female has known T10 fracture, date unknown, but seen on xrays on 03/05/22.   She is feeling much better with no current back pain. No leg pain. No numbness, tingling, or weakness.    Xrays show stable T10 fracture along with chronic fractures at T12 and L1.   Treatment options discussed with patient and following plan made:   - She can wean out of her TLSO brace. Still care with bending and twisting. Would avoid lifting if possible.  - Follow up prn.   I spent a total of 15  minutes in face-to-face and non-face-to-face activities related to this patient's care today including review of outside records, review of imaging, review of symptoms, physical exam, discussion of differential diagnosis, discussion of treatment options, and documentation.   Glade Boys PA-C Dept. of Neurosurgery

## 2023-03-04 ENCOUNTER — Ambulatory Visit: Payer: Medicare HMO | Admitting: Orthopedic Surgery

## 2023-03-04 NOTE — Progress Notes (Addendum)
 Referring Physician:  No referring provider defined for this encounter.  Primary Physician:  Fernande Ophelia JINNY DOUGLAS, MD  History of Present Illness: Sandra Riddle has a history of osteoporosis, hyperlipidemia, and hypothyroidism.   Last seen by me on 06/11/22 for T10 compression fracture, date unknown, but seen on xrays on 03/05/22. Also with chronic fractures at T12 and L1.   Her last xrays showed stable T10 fracture along with chronic fractures at T12 and L1. She had no low back pain and was to follow up prn.   She is here with a 2 week history of constant diffuse mid to lower back pain with some radiation into her abdomen. Pain is worse with moving and better with rest. No known injury. No arm or leg pain. No numbness or weakness. She has tingling in her mid back.   She is taking prn tylenol , neurontin, and norco (had a few left).   Past Surgery: no lumbar surgery   Review of Systems:  A 10 point review of systems is negative, except for the pertinent positives and negatives detailed in the HPI.  Past Medical History: Past Medical History:  Diagnosis Date   Closed T10 fracture (HCC)    Hyperlipidemia    Hypothyroidism    Moderate aortic valve insufficiency    Osteoporosis     Past Surgical History: Past Surgical History:  Procedure Laterality Date   TUBAL LIGATION      Allergies: Allergies as of 03/05/2023   (No Known Allergies)    Medications: Outpatient Encounter Medications as of 03/05/2023  Medication Sig   acetaminophen  (TYLENOL ) 650 MG CR tablet Take 650 mg by mouth every 8 (eight) hours as needed for pain.   alendronate (FOSAMAX) 70 MG tablet Take 70 mg by mouth once a week.   CALCIUM-VITAMIN D PO Take by mouth daily.   diphenhydrAMINE (BENADRYL ALLERGY) 25 MG tablet Take 25 mg by mouth in the morning.   dorzolamide-timolol (COSOPT) 2-0.5 % ophthalmic solution Apply 1 drop to eye 2 (two) times daily.   levothyroxine (SYNTHROID) 75 MCG tablet Take 75 mcg by  mouth daily before breakfast.   Loteprednol Etabonate 0.5 % GEL Apply to eye.   prednisoLONE acetate (PRED FORTE) 1 % ophthalmic suspension Place 1 drop into both eyes 4 (four) times daily.   PROLENSA 0.07 % SOLN Apply 1 drop to eye at bedtime.   rosuvastatin (CRESTOR) 5 MG tablet Take 5 mg by mouth daily.   No facility-administered encounter medications on file as of 03/05/2023.    Social History: Social History   Tobacco Use   Smoking status: Former   Smokeless tobacco: Never  Substance Use Topics   Alcohol use: Not Currently   Drug use: Not Currently    Family Medical History: No family history on file.  Physical Examination: There were no vitals filed for this visit.  Awake, alert, oriented to person, place, and time.  Speech is clear and fluent. Fund of knowledge is appropriate.   Cranial Nerves: Pupils equal round and reactive to light.  Facial tone is symmetric.    She has diffuse TL tenderness. Worst pain appears to be around T7-T8 area.   No abnormal lesions/rashes on TL skin area.   Strength: Side Biceps Triceps Deltoid Interossei Grip Wrist Ext. Wrist Flex.  R 5 5 5 5 5 5 5   L 5 5 5 5 5 5 5    Side Iliopsoas Quads Hamstring PF DF EHL  R 5 5 5 5  5  5  L 5 5 5 5 5 5    Reflexes are 2+ and symmetric at the biceps, triceps, brachioradialis, patella and achilles.   Hoffman's is absent.  Clonus is not present.   Bilateral upper and lower extremity sensation is intact to light touch.     Gait is slow.   Medical Decision Making  Imaging: none   Assessment and Plan: Sandra Riddle is a pleasant 78 y.o. female has known T10 fracture, date unknown, but seen on xrays on 03/05/22. Also with known chronic fractures at T12 and L1.   She is here with a 2 week history of constant diffuse mid to lower back pain with some radiation into her abdomen. Pain is worse with moving and better with rest. No known injury. No arm or leg pain. No numbness or weakness. She has tingling in  her mid back.   No updated imaging.   Treatment options discussed with patient and following plan made:   - Will get thoracic and lumbar xrays today.  - Care with bending, twisting, or lifting.  - One time refill of norco to use for severe pain. Reviewed dosing and side effects. PMP reviewed and is appropriate.  - She can take OTC tylenol  as directed. Max of 3000-4000mg  per day. Reminded to count 325mg  of tylenol  in each norco.  - Depending on xray results, may need to consider further imaging such as MRI.  - Will set up phone visit to review xrays.   BP was elevated. No symptoms of chest pain, shortness of breath, blurry vision, or headaches. Will recheck at home and call PCP if not improved. If she develops CP, SOB, blurry vision, or headaches, then she will go to ED.     I spent a total of 20 minutes in face-to-face and non-face-to-face activities related to this patient's care today including review of outside records, review of imaging, review of symptoms, physical exam, discussion of differential diagnosis, discussion of treatment options, and documentation.   ADDENDUM 03/06/23:  Lumbar xrays dated 03/05/23:  New compression deformity at T8 and possibly T7. Old compression fractures at L1, T10, T12.   Xrays reviewed with Dr. Claudene. Not read yet by radiology.   Called patient with above results. Recommend she get back in her TLSO. She thinks it is broken. Order for new TLSO sent to [facility], but she will bring old brace with her to see if it can be fixed. No bending, twisting, or lifting.   Continue prn norco. Call if she needs refill.   Of note, she had DEXA 04/13/21 that showed osteopenia. May need referral to endocrine- is she still taking fosamax? Will discuss at her follow up.   Follow up with me in 4 weeks with repeat thoracic xrays.   Sandra Boys PA-C Dept. of Neurosurgery

## 2023-03-05 ENCOUNTER — Ambulatory Visit (INDEPENDENT_AMBULATORY_CARE_PROVIDER_SITE_OTHER): Payer: Medicare HMO | Admitting: Orthopedic Surgery

## 2023-03-05 ENCOUNTER — Ambulatory Visit
Admission: RE | Admit: 2023-03-05 | Discharge: 2023-03-05 | Disposition: A | Discharge status: Home / Self Care | Payer: Medicare HMO | Source: Ambulatory Visit | Attending: Orthopedic Surgery | Admitting: Orthopedic Surgery

## 2023-03-05 ENCOUNTER — Encounter: Payer: Self-pay | Admitting: Orthopedic Surgery

## 2023-03-05 ENCOUNTER — Ambulatory Visit
Admission: RE | Admit: 2023-03-05 | Discharge: 2023-03-05 | Disposition: A | Discharge status: Home / Self Care | Payer: Medicare HMO | Attending: Orthopedic Surgery | Admitting: Orthopedic Surgery

## 2023-03-05 VITALS — BP 158/102 | Ht 60.0 in | Wt 137.0 lb

## 2023-03-05 DIAGNOSIS — S22080S Wedge compression fracture of T11-T12 vertebra, sequela: Secondary | ICD-10-CM

## 2023-03-05 DIAGNOSIS — M546 Pain in thoracic spine: Secondary | ICD-10-CM | POA: Diagnosis present

## 2023-03-05 DIAGNOSIS — S22060A Wedge compression fracture of T7-T8 vertebra, initial encounter for closed fracture: Secondary | ICD-10-CM

## 2023-03-05 DIAGNOSIS — M545 Low back pain, unspecified: Secondary | ICD-10-CM | POA: Diagnosis present

## 2023-03-05 DIAGNOSIS — S22070S Wedge compression fracture of T9-T10 vertebra, sequela: Secondary | ICD-10-CM | POA: Insufficient documentation

## 2023-03-05 DIAGNOSIS — S32010S Wedge compression fracture of first lumbar vertebra, sequela: Secondary | ICD-10-CM | POA: Insufficient documentation

## 2023-03-05 DIAGNOSIS — S22070D Wedge compression fracture of T9-T10 vertebra, subsequent encounter for fracture with routine healing: Secondary | ICD-10-CM | POA: Diagnosis not present

## 2023-03-05 MED ORDER — HYDROCODONE-ACETAMINOPHEN 5-325 MG PO TABS: 1.0000 | ORAL_TABLET | Freq: Four times a day (QID) | ORAL | 0 refills | Status: DC | PRN

## 2023-03-05 NOTE — Patient Instructions (Signed)
 It was so nice to see you today. Thank you so much for coming in.    I want to get some xrays of your mid and lower back. You can get these at [facility] (building with the white pillars) off of [address]. The address is [address] [place], [place] [address]. You do not need any appointment. We can do phone visit to discuss results and further plan.   I sent a prescription for hydrocodone  to help with severe pain. Take this only as needed. It can make you feel sleepy, dizzy, and cause constipation.   You can take over the counter tylenol  as directed on bottle with max of 3000 to 4000 mg per day. Remember each hydrocodone  has 325mg  of tylenol  in it.   Your blood pressure was elevated today. I want you to recheck it at home and follow up with your PCP if it remains high. If you have any chest pain, shortness of breath, blurry vision, or headaches then you need to go to emergency room.   Please do not hesitate to call if you have any questions or concerns. You can also message me in MyChart.   Glade Boys PA-C [phone]     The physicians and staff at [facility] Neurosurgery at [facility] are committed to providing excellent care. You may receive a survey asking for feedback about your experience at our office. We value you your feedback and appreciate you taking the time to to fill it out. The [facility] leadership team is also available to discuss your experience in person, feel free to contact us  [phone].

## 2023-03-06 NOTE — Addendum Note (Signed)
 Addended by: Drake Leach on: 03/06/2023 04:44 PM   Modules accepted: Orders

## 2023-03-11 ENCOUNTER — Telehealth: Payer: Self-pay | Admitting: Orthopedic Surgery

## 2023-03-11 ENCOUNTER — Other Ambulatory Visit: Payer: Self-pay

## 2023-03-11 ENCOUNTER — Telehealth: Payer: Self-pay

## 2023-03-11 DIAGNOSIS — S22060A Wedge compression fracture of T7-T8 vertebra, initial encounter for closed fracture: Secondary | ICD-10-CM

## 2023-03-11 DIAGNOSIS — M546 Pain in thoracic spine: Secondary | ICD-10-CM

## 2023-03-11 DIAGNOSIS — S22070S Wedge compression fracture of T9-T10 vertebra, sequela: Secondary | ICD-10-CM

## 2023-03-11 DIAGNOSIS — S32010S Wedge compression fracture of first lumbar vertebra, sequela: Secondary | ICD-10-CM

## 2023-03-11 DIAGNOSIS — M545 Low back pain, unspecified: Secondary | ICD-10-CM

## 2023-03-11 DIAGNOSIS — S22080S Wedge compression fracture of T11-T12 vertebra, sequela: Secondary | ICD-10-CM

## 2023-03-11 MED ORDER — HYDROCODONE-ACETAMINOPHEN 5-325 MG PO TABS: 1.0000 | ORAL_TABLET | Freq: Four times a day (QID) | ORAL | 0 refills | Status: DC | PRN

## 2023-03-11 NOTE — Telephone Encounter (Signed)
 I called the patient to provide education regarding awareness of tylenol  daily maximums with the patient. She indicates her understanding. She mentions that she is waiting to hear back about her neck brace from [facility]. I told her to follow back up with us  if she does not hear back from them soon.

## 2023-03-11 NOTE — Telephone Encounter (Signed)
 Randine called from [facility] in regards to the thoracic xrays.   Please see impression below:  IMPRESSION: 1. New compression fracture of T8, with loss of anterior vertebral body height by approximately 25%. 2. Stable compression fractures at T10, T12, and L1. 3. These results will be called to the ordering clinician or representative by the Radiologist Assistant, and communication documented in the PACS or [employer].

## 2023-03-11 NOTE — Telephone Encounter (Signed)
 I will fax the order again to [facility].   Would you be willing to refill Hydrocodone?

## 2023-03-11 NOTE — Telephone Encounter (Signed)
 Xrays reviewed last week regarding new T8 compression fracture.

## 2023-03-11 NOTE — Telephone Encounter (Addendum)
 She has new T8 compression fracture. Refill for norco okay. PMP reviewed and is appropriate.   Please let her know that each norco has 325mg  of tylenol  in it. Be careful taking additional OTC tylenol . She should not take more than 3000-4000mg  of tylenol  per day.

## 2023-03-11 NOTE — Telephone Encounter (Signed)
 She is calling because she has not heard anything from [facility] regarding a new brace. Also would you be willing to refill her Hydrocodone acetaminophen to [facility] [address].

## 2023-03-31 NOTE — Progress Notes (Signed)
 Referring Physician:  Fernande Ophelia JINNY DOUGLAS, MD [address] [facility] [place],  [place] [address]  Primary Physician:  Sandra Ophelia JINNY DOUGLAS, MD  History of Present Illness: [name] has a history of osteoporosis, hyperlipidemia, and hypothyroidism.   She has known T10 compression fracture, date unknown, but seen on xrays on 03/05/22. Also with chronic fractures at T12 and L1.   Last seen by me on 03/05/23 with increased mid to lower back pain. No arm or leg pain. Xrays showed new compression fracture at T8.   Order for TLSO brace sent to [facility]. She is here for follow up and repeat xrays.   She is doing much better! She is wearing her TLSO brace and it helps. She has intermittent pain under her right shoulder blade when she gets up from seated position. No arm or leg pain. No numbness, tingling, or weakness.   She had DEXA 04/13/21 showing osteopenia and has been on fosamax.   No bowel or bladder issues.   Review of Systems:  A 10 point review of systems is negative, except for the pertinent positives and negatives detailed in the HPI.  Past Medical History: Past Medical History:  Diagnosis Date   Closed T10 fracture (HCC)    Hyperlipidemia    Hypothyroidism    Moderate aortic valve insufficiency    Osteoporosis     Past Surgical History: Past Surgical History:  Procedure Laterality Date   TUBAL LIGATION      Allergies: Allergies as of 04/03/2023   (No Known Allergies)    Medications: Outpatient Encounter Medications as of 04/03/2023  Medication Sig   acetaminophen  (TYLENOL ) 650 MG CR tablet Take 650 mg by mouth every 8 (eight) hours as needed for pain.   alendronate (FOSAMAX) 70 MG tablet Take 70 mg by mouth once a week.   CALCIUM-VITAMIN D PO Take by mouth daily.   diphenhydrAMINE (BENADRYL ALLERGY) 25 MG tablet Take 25 mg by mouth in the morning.   dorzolamide-timolol (COSOPT) 2-0.5 % ophthalmic solution Apply 1 drop to eye 2 (two) times daily.    gabapentin (NEURONTIN) 100 MG capsule Take 100 mg by mouth daily.   HYDROcodone -acetaminophen  (NORCO) 5-325 MG tablet Take 1 tablet by mouth every 6 (six) hours as needed for severe pain (pain score 7-10).   levothyroxine (SYNTHROID) 75 MCG tablet Take 75 mcg by mouth daily before breakfast.   prednisoLONE acetate (PRED FORTE) 1 % ophthalmic suspension Place 1 drop into both eyes 4 (four) times daily.   PROLENSA 0.07 % SOLN Apply 1 drop to eye at bedtime.   rosuvastatin (CRESTOR) 5 MG tablet Take 5 mg by mouth daily.   triamterene-hydrochlorothiazide (MAXZIDE-25) 37.5-25 MG tablet Take 1 tablet by mouth daily.   No facility-administered encounter medications on file as of 04/03/2023.    Social History: Social History   Tobacco Use   Smoking status: Former   Smokeless tobacco: Never  Substance Use Topics   Alcohol use: Not Currently   Drug use: Not Currently    Family Medical History: No family history on file.  Physical Examination: There were no vitals filed for this visit.  Awake, alert, oriented to person, place, and time.  Speech is clear and fluent. Fund of knowledge is appropriate.   Cranial Nerves: Pupils equal round and reactive to light.  Facial tone is symmetric.    No TL tenderness. Minimal tenderness under right scapula.   No abnormal lesions/rashes on TL skin area.  Strength: Side Biceps Triceps Deltoid Interossei Grip Wrist Ext. Wrist Flex.  R 5 5 5 5 5 5 5   L 5 5 5 5 5 5 5    Side Iliopsoas Quads Hamstring PF DF EHL  R 5 5 5 5 5 5   L 5 5 5 5 5 5    Reflexes are 2+ and symmetric at the biceps, triceps, brachioradialis, patella and achilles.   Hoffman's is absent.  Clonus is not present.   Bilateral upper and lower extremity sensation is intact to light touch.     Gait is normal. She is wearing her brace.   Medical Decision Making  Imaging: Thoracic xrays dated 04/03/23:  Stable T8 compression fracture compared to xrays from 03/05/23. Also with compression  fractures at T10, T12, and L1.   Report not yet available for above xrays.   Assessment and Plan: Sandra Riddle is a pleasant 78 y.o. female has known T10 fracture, date unknown, but seen on xrays on 03/05/22. Also with known chronic fractures at T12 and L1.   New T8 compression fracture that likely occurred on 02/20/23 (when pain started). No known injury. She is doing better and pain is much better.   Xrays show stable T8 compression fracture. Older fractures at T10, T12, and L1.   Treatment options discussed with patient and following plan made:   - Continue with TLSO brace. It has broken again. She will call [facility].  - No bending, twisting, or lifting.  - Follow up with me in 6 weeks with repeat xrays. If doing well at that time, can release her.     I spent a total of 15 minutes in face-to-face and non-face-to-face activities related to this patient's care today including review of outside records, review of imaging, review of symptoms, physical exam, discussion of differential diagnosis, discussion of treatment options, and documentation.   Glade Boys PA-C Dept. of Neurosurgery

## 2023-04-03 ENCOUNTER — Ambulatory Visit
Admission: RE | Admit: 2023-04-03 | Discharge: 2023-04-03 | Disposition: A | Discharge status: Home / Self Care | Payer: Medicare HMO | Attending: Orthopedic Surgery | Admitting: Orthopedic Surgery

## 2023-04-03 ENCOUNTER — Ambulatory Visit
Admission: RE | Admit: 2023-04-03 | Discharge: 2023-04-03 | Disposition: A | Discharge status: Home / Self Care | Payer: Medicare HMO | Source: Ambulatory Visit | Attending: Orthopedic Surgery | Admitting: Orthopedic Surgery

## 2023-04-03 ENCOUNTER — Encounter: Payer: Self-pay | Admitting: Orthopedic Surgery

## 2023-04-03 ENCOUNTER — Ambulatory Visit: Payer: Medicare HMO | Admitting: Orthopedic Surgery

## 2023-04-03 VITALS — BP 134/84 | Ht 60.0 in | Wt 137.0 lb

## 2023-04-03 DIAGNOSIS — S22060D Wedge compression fracture of T7-T8 vertebra, subsequent encounter for fracture with routine healing: Secondary | ICD-10-CM

## 2023-04-03 DIAGNOSIS — S22060A Wedge compression fracture of T7-T8 vertebra, initial encounter for closed fracture: Secondary | ICD-10-CM

## 2023-04-03 NOTE — Patient Instructions (Signed)
 It was so nice to see you today. Thank you so much for coming in.    You have a broken bone at T8. Your xrays look good.   Continue wearing your brace. No bending, twisting, or lifting. Remove brace to sleep.   Ask Dr. Fernande about a new bone density exam. I will send him a message as well.   Call [facility] about your brace.   I will see you back in 6 weeks. You will need to get xrays prior to this visit.   Please do not hesitate to call if you have any questions or concerns. You can also message me in MyChart.    Glade Boys PA-C [phone]     The physicians and staff at [facility] Neurosurgery at [facility] are committed to providing excellent care. You may receive a survey asking for feedback about your experience at our office. We value you your feedback and appreciate you taking the time to to fill it out. The [facility] leadership team is also available to discuss your experience in person, feel free to contact us  [phone].

## 2023-05-07 NOTE — Progress Notes (Signed)
 Referring Physician:  Lynnea Ferrier, MD [address] [facility] [place],  [place] [address]  Primary Physician:  Sandra Ferrier, MD  History of Present Illness: [name] has a history of osteoporosis, hyperlipidemia, and hypothyroidism.   She has known T10 compression fracture, date unknown, but seen on xrays on 03/05/22. Also with chronic fractures at T12 and L1. Found to have new compression fracture at T8 on 03/05/23.   She has been in TLSO and is here for follow up and repeat xrays.   Her back pain is still getting better. She has pain when she is showering and also had pain when she stands to wash dishes. No pain with moving around in bed. Pain under her right shoulder blade is gone. No arm or leg pain. No numbness, tingling, or weakness.   She had recent DEXA and PCP wanted her to stay on fosamax.   No bowel or bladder issues.   Review of Systems:  A 10 point review of systems is negative, except for the pertinent positives and negatives detailed in the HPI.  Past Medical History: Past Medical History:  Diagnosis Date   Closed T10 fracture (HCC)    Hyperlipidemia    Hypothyroidism    Moderate aortic valve insufficiency    Osteoporosis     Past Surgical History: Past Surgical History:  Procedure Laterality Date   TUBAL LIGATION      Allergies: Allergies as of 05/15/2023   (No Known Allergies)    Medications: Outpatient Encounter Medications as of 05/15/2023  Medication Sig   acetaminophen (TYLENOL) 650 MG CR tablet Take 650 mg by mouth every 8 (eight) hours as needed for pain.   alendronate (FOSAMAX) 70 MG tablet Take 70 mg by mouth once a week.   CALCIUM-VITAMIN D PO Take by mouth daily.   dorzolamide-timolol (COSOPT) 2-0.5 % ophthalmic solution Apply 1 drop to eye 2 (two) times daily.   gabapentin (NEURONTIN) 100 MG capsule Take 100 mg by mouth daily.   levothyroxine (SYNTHROID) 75 MCG tablet Take 75 mcg by mouth daily before  breakfast.   losartan (COZAAR) 25 MG tablet Take 1 tablet by mouth daily.   loteprednol (LOTEMAX) 0.5 % ophthalmic suspension 1 drop 3 (three) times daily.   prednisoLONE acetate (PRED FORTE) 1 % ophthalmic suspension Place 1 drop into both eyes 4 (four) times daily.   PROLENSA 0.07 % SOLN Apply 1 drop to eye at bedtime.   rosuvastatin (CRESTOR) 5 MG tablet Take 5 mg by mouth daily.   triamterene-hydrochlorothiazide (MAXZIDE-25) 37.5-25 MG tablet Take 1 tablet by mouth daily.   [DISCONTINUED] diphenhydrAMINE (BENADRYL ALLERGY) 25 MG tablet Take 25 mg by mouth in the morning.   [DISCONTINUED] HYDROcodone-acetaminophen (NORCO) 5-325 MG tablet Take 1 tablet by mouth every 6 (six) hours as needed for severe pain (pain score 7-10).   No facility-administered encounter medications on file as of 05/15/2023.    Social History: Social History   Tobacco Use   Smoking status: Former   Smokeless tobacco: Never  Substance Use Topics   Alcohol use: Not Currently   Drug use: Not Currently    Family Medical History: History reviewed. No pertinent family history.  Physical Examination: Vitals:   05/15/23 1310  BP: 132/88    Awake, alert, oriented to person, place, and time.  Speech is clear and fluent. Fund of knowledge is appropriate.   Cranial Nerves: Pupils equal round and reactive to light.  Facial tone is  symmetric.    No TL tenderness.   Strength: Side Biceps Triceps Deltoid Interossei Grip Wrist Ext. Wrist Flex.  R 5 5 5 5 5 5 5   L 5 5 5 5 5 5 5    Side Iliopsoas Quads Hamstring PF DF EHL  R 5 5 5 5 5 5   L 5 5 5 5 5 5    Reflexes are 2+ and symmetric at the biceps, triceps, brachioradialis, patella and achilles.   Hoffman's is absent.  Clonus is not present.   Bilateral upper and lower extremity sensation is intact to light touch.     Gait is normal. She is wearing her brace.   Medical Decision Making  Imaging: Thoracic xrays dated 05/15/23:  Stable T8 compression fracture  compared to xrays from last visit. Also with compression fractures at T10, T12, and L1.   Report not yet available for above xrays.   Assessment and Plan: Sandra Riddle has known chronic T10, T12, and L1 fractures.   Also with new T8 compression fracture that likely occurred on 02/20/23 (when pain started). No known injury. She continues to improve.   Xrays show stable T8 compression fracture. Older fractures at T10, T12, and L1.   Treatment options discussed with patient and following plan made:   - Continue with TLSO brace. Remove to sleep. Can remove if sitting and watching TV.  - No bending, twisting, or lifting.  - Follow up with me in 4 weeks with repeat xrays. If doing well at that time, can release her.     I spent a total of 15 minutes in face-to-face and non-face-to-face activities related to this patient's care today including review of outside records, review of imaging, review of symptoms, physical exam, discussion of differential diagnosis, discussion of treatment options, and documentation.   Sandra Leach PA-C Dept. of Neurosurgery

## 2023-05-15 ENCOUNTER — Encounter: Payer: Self-pay | Admitting: Orthopedic Surgery

## 2023-05-15 ENCOUNTER — Ambulatory Visit: Payer: Medicare HMO | Admitting: Orthopedic Surgery

## 2023-05-15 ENCOUNTER — Ambulatory Visit
Admission: RE | Admit: 2023-05-15 | Discharge: 2023-05-15 | Disposition: A | Discharge status: Home / Self Care | Attending: Orthopedic Surgery | Admitting: Orthopedic Surgery

## 2023-05-15 ENCOUNTER — Ambulatory Visit
Admission: RE | Admit: 2023-05-15 | Discharge: 2023-05-15 | Disposition: A | Discharge status: Home / Self Care | Source: Ambulatory Visit | Attending: Orthopedic Surgery | Admitting: Orthopedic Surgery

## 2023-05-15 VITALS — BP 132/88 | Ht 60.0 in | Wt 137.0 lb

## 2023-05-15 DIAGNOSIS — S22060D Wedge compression fracture of T7-T8 vertebra, subsequent encounter for fracture with routine healing: Secondary | ICD-10-CM | POA: Insufficient documentation

## 2023-05-15 NOTE — Patient Instructions (Signed)
 It was so nice to see you today.   You have a broken bone at T8. Your xrays look good.   Continue wearing your brace. No bending, twisting, or lifting. Remove brace to sleep.   I will see you back in 4 weeks. You will need to get xrays prior to this visit.   Please do not hesitate to call if you have any questions or concerns. You can also message me in MyChart.    Drake Leach PA-C [phone]     The physicians and staff at [facility] at [facility] are committed to providing excellent care. You may receive a survey asking for feedback about your experience at our office. We value you your feedback and appreciate you taking the time to to fill it out. The [facility] leadership team is also available to discuss your experience in person, feel free to contact us [phone].

## 2023-05-24 ENCOUNTER — Encounter (INDEPENDENT_AMBULATORY_CARE_PROVIDER_SITE_OTHER): Payer: Medicare HMO | Admitting: Ophthalmology

## 2023-05-24 DIAGNOSIS — H43813 Vitreous degeneration, bilateral: Secondary | ICD-10-CM

## 2023-05-24 DIAGNOSIS — H59033 Cystoid macular edema following cataract surgery, bilateral: Secondary | ICD-10-CM | POA: Diagnosis not present

## 2023-05-31 NOTE — Progress Notes (Signed)
 Referring Physician:  Lynnea Ferrier, MD [address] [facility] [place],  [place] [address]  Primary Physician:  Sandra Ferrier, MD  History of Present Illness: [name] has a history of osteoporosis, hyperlipidemia, and hypothyroidism.   She has known T10 compression fracture, date unknown, but seen on xrays on 03/05/22. Also with chronic fractures at T12 and L1. Found to have new compression fracture at T8 on 03/05/23.   She has been in TLSO and is here for follow up and repeat xrays.   She continues to improve. She has minimal mid to lower back pain with prolonged standing. She still has to sit in shower chair to take a shower. No arm or leg pain. No numbness, tingling, or weakness.   Review of Systems:  A 10 point review of systems is negative, except for the pertinent positives and negatives detailed in the HPI.  Past Medical History: Past Medical History:  Diagnosis Date   Closed T10 fracture (HCC)    Hyperlipidemia    Hypothyroidism    Moderate aortic valve insufficiency    Osteoporosis     Past Surgical History: Past Surgical History:  Procedure Laterality Date   TUBAL LIGATION      Allergies: Allergies as of 06/05/2023   (No Known Allergies)    Medications: Outpatient Encounter Medications as of 06/05/2023  Medication Sig   acetaminophen (TYLENOL) 650 MG CR tablet Take 650 mg by mouth every 8 (eight) hours as needed for pain.   alendronate (FOSAMAX) 70 MG tablet Take 70 mg by mouth once a week.   CALCIUM-VITAMIN D PO Take by mouth daily.   dorzolamide-timolol (COSOPT) 2-0.5 % ophthalmic solution Apply 1 drop to eye 2 (two) times daily.   gabapentin (NEURONTIN) 100 MG capsule Take 100 mg by mouth daily.   levothyroxine (SYNTHROID) 75 MCG tablet Take 75 mcg by mouth daily before breakfast.   losartan (COZAAR) 25 MG tablet Take 1 tablet by mouth daily.   loteprednol (LOTEMAX) 0.5 % ophthalmic suspension 1 drop 3 (three) times daily.    prednisoLONE acetate (PRED FORTE) 1 % ophthalmic suspension Place 1 drop into both eyes 4 (four) times daily.   PROLENSA 0.07 % SOLN Apply 1 drop to eye at bedtime.   rosuvastatin (CRESTOR) 5 MG tablet Take 5 mg by mouth daily.   triamterene-hydrochlorothiazide (MAXZIDE-25) 37.5-25 MG tablet Take 1 tablet by mouth daily.   No facility-administered encounter medications on file as of 06/05/2023.    Social History: Social History   Tobacco Use   Smoking status: Former   Smokeless tobacco: Never  Substance Use Topics   Alcohol use: Not Currently   Drug use: Not Currently    Family Medical History: No family history on file.  Physical Examination: There were no vitals filed for this visit.   Awake, alert, oriented to person, place, and time.  Speech is clear and fluent. Fund of knowledge is appropriate.   Cranial Nerves: Pupils equal round and reactive to light.  Facial tone is symmetric.    No TL tenderness.   Strength: Side Biceps Triceps Deltoid Interossei Grip Wrist Ext. Wrist Flex.  R 5 5 5 5 5 5 5   L 5 5 5 5 5 5 5    Side Iliopsoas Quads Hamstring PF DF EHL  R 5 5 5 5 5 5   L 5 5 5 5 5 5    Reflexes are 2+ and symmetric at the biceps, triceps, brachioradialis, patella and  achilles.   Hoffman's is absent.  Clonus is not present.   Bilateral upper and lower extremity sensation is intact to light touch.     Gait is normal. She is wearing her brace.   Medical Decision Making  Imaging: Thoracic xrays dated 06/05/23:  Stable T8 compression fracture compared to xrays from last visit. Also with compression fractures at T10, T12, and L1.   Report not yet available for above xrays.   Assessment and Plan: [name] has known chronic T10, T12, and L1 fractures.   Found to have new compression fracture at T8 on 03/05/23. Thinks it occurred around christmas as that's when her pain started.   Pain is improving and she is doing well.   Xrays show stable T8 compression  fracture. Older fractures at T10, T12, and L1.   Treatment options discussed with patient and following plan made:   - She can stop wearing TLSO brace. May need to wean out of slowly depending on pain.  - Can start gentle bending and twisting. Would still avoid any significant lifting.  - She will follow up with me prn.   I spent a total of 15 minutes in face-to-face and non-face-to-face activities related to this patient's care today including review of outside records, review of imaging, review of symptoms, physical exam, discussion of differential diagnosis, discussion of treatment options, and documentation.   Drake Leach PA-C Dept. of Neurosurgery

## 2023-06-05 ENCOUNTER — Ambulatory Visit: Admitting: Orthopedic Surgery

## 2023-06-05 ENCOUNTER — Encounter: Payer: Self-pay | Admitting: Orthopedic Surgery

## 2023-06-05 ENCOUNTER — Ambulatory Visit
Admission: RE | Admit: 2023-06-05 | Discharge: 2023-06-05 | Disposition: A | Discharge status: Home / Self Care | Attending: Orthopedic Surgery | Admitting: Orthopedic Surgery

## 2023-06-05 ENCOUNTER — Ambulatory Visit
Admission: RE | Admit: 2023-06-05 | Discharge: 2023-06-05 | Disposition: A | Discharge status: Home / Self Care | Source: Ambulatory Visit | Attending: Orthopedic Surgery | Admitting: Orthopedic Surgery

## 2023-06-05 VITALS — BP 136/88 | Ht 60.0 in | Wt 137.0 lb

## 2023-06-05 DIAGNOSIS — S22060D Wedge compression fracture of T7-T8 vertebra, subsequent encounter for fracture with routine healing: Secondary | ICD-10-CM | POA: Insufficient documentation

## 2023-09-20 ENCOUNTER — Encounter (INDEPENDENT_AMBULATORY_CARE_PROVIDER_SITE_OTHER): Admitting: Ophthalmology

## 2023-09-20 DIAGNOSIS — H59033 Cystoid macular edema following cataract surgery, bilateral: Secondary | ICD-10-CM

## 2023-09-20 DIAGNOSIS — H43813 Vitreous degeneration, bilateral: Secondary | ICD-10-CM
